# Patient Record
Sex: Male | Born: 2000 | Race: Black or African American | Hispanic: No | Marital: Single | Smoking: Never smoker
Health system: Southern US, Community
[De-identification: ages and names within clinical notes are randomized; demographics above are authoritative.]

## PROBLEM LIST (undated history)

## (undated) DIAGNOSIS — K5900 Constipation, unspecified: Secondary | ICD-10-CM

## (undated) DIAGNOSIS — K219 Gastro-esophageal reflux disease without esophagitis: Secondary | ICD-10-CM

## (undated) DIAGNOSIS — J45909 Unspecified asthma, uncomplicated: Secondary | ICD-10-CM

## (undated) DIAGNOSIS — D573 Sickle-cell trait: Secondary | ICD-10-CM

## (undated) DIAGNOSIS — R011 Cardiac murmur, unspecified: Secondary | ICD-10-CM

## (undated) DIAGNOSIS — L309 Dermatitis, unspecified: Secondary | ICD-10-CM

## (undated) HISTORY — DX: Dermatitis, unspecified: L30.9

---
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
## 2021-05-12 ENCOUNTER — Other Ambulatory Visit: Payer: Self-pay

## 2021-05-12 ENCOUNTER — Ambulatory Visit
Admission: EM | Admit: 2021-05-12 | Discharge: 2021-05-12 | Disposition: A | Discharge status: Home / Self Care | Payer: Medicaid Other | Attending: Physician Assistant | Admitting: Physician Assistant

## 2021-05-12 DIAGNOSIS — J101 Influenza due to other identified influenza virus with other respiratory manifestations: Secondary | ICD-10-CM

## 2021-05-12 LAB — POCT INFLUENZA A/B
Influenza A, POC: POSITIVE — AB
Influenza B, POC: POSITIVE — AB

## 2021-05-12 MED ORDER — OSELTAMIVIR PHOSPHATE 75 MG PO CAPS: 75.0000 mg | ORAL_CAPSULE | Freq: Two times a day (BID) | ORAL | 0 refills | Status: DC

## 2021-05-12 MED ORDER — ONDANSETRON 4 MG PO TBDP: 4.0000 mg | ORAL_TABLET | Freq: Three times a day (TID) | ORAL | 0 refills | Status: DC | PRN

## 2021-05-12 NOTE — ED Triage Notes (Signed)
3 day h/o abdominal pain, diarrhea and nausea with two days of congestion. No meds taken. Denies cough and fever.

## 2021-05-12 NOTE — ED Provider Notes (Signed)
Jeff Logan    CSN: [id number] Arrival date & time: 05/12/21  1241      History   Chief Complaint Chief Complaint  Patient presents with   Abdominal Pain   Diarrhea    HPI Jeff Logan is a 20 y.o. male.   Patient here today for evaluation of congestion, sore throat, nausea, vomiting and diarrhea that started 2 days ago.  He has not had fever.  He has had exposure to the flu.  He has not tried any medication for symptoms.  The history is provided by the patient.  Abdominal Pain Associated symptoms: diarrhea, nausea, sore throat and vomiting   Associated symptoms: no chills, no cough, no fever and no shortness of breath   Diarrhea Associated symptoms: abdominal pain and vomiting   Associated symptoms: no chills and no fever    Past Medical History:  Diagnosis Date   Asthma    Constipation    Eczema    GERD (gastroesophageal reflux disease)    Murmur    Sickle cell trait (HCC)     There are no problems to display for this patient.   History reviewed. No pertinent surgical history.     Home Medications    Prior to Admission medications   Medication Sig Start Date End Date Taking? Authorizing Provider  ondansetron (ZOFRAN-ODT) 4 MG disintegrating tablet Take 1 tablet (4 mg total) by mouth every 8 (eight) hours as needed for nausea or vomiting. 05/12/21  Yes Tomi Bamberger, PA-C  oseltamivir (TAMIFLU) 75 MG capsule Take 1 capsule (75 mg total) by mouth every 12 (twelve) hours. 05/12/21  Yes Tomi Bamberger, PA-C  albuterol (PROVENTIL HFA;VENTOLIN HFA) 108 (90 BASE) MCG/ACT inhaler Inhale 1 puff into the lungs every 6 (six) hours as needed for wheezing or shortness of breath.    [provider]  Beclomethasone Dipropionate (QVAR IN) Inhale into the lungs.    [provider]  ibuprofen (ADVIL) 800 MG tablet Take 1 tablet (800 mg total) by mouth 3 (three) times daily. 08/24/20   Wieters, Hallie C, PA-C  lansoprazole  (PREVACID) 15 MG capsule Take 15 mg by mouth daily at 12 noon.    [provider]  loratadine (CLARITIN) 10 MG tablet Take 10 mg by mouth daily.    [provider]  Polyethylene Glycol 3350 (MIRALAX PO) Take by mouth.    [provider]    Family History Family History  Problem Relation Age of Onset   Thyroid disease Mother    Heart disease Father    Heart attack Father    Sickle cell trait Father    Sickle cell trait Sister    Asthma Brother     Social History Social History   Tobacco Use   Smoking status: Never   Smokeless tobacco: Never  Vaping Use   Vaping Use: Never used  Substance Use Topics   Alcohol use: Never   Drug use: Never     Allergies   Cinnamon, Penicillins, and Zantac [ranitidine hcl]   Review of Systems Review of Systems  Constitutional:  Negative for chills and fever.  HENT:  Positive for congestion and sore throat. Negative for ear pain.   Eyes:  Negative for discharge and redness.  Respiratory:  Negative for cough and shortness of breath.   Gastrointestinal:  Positive for abdominal pain, diarrhea, nausea and vomiting.    Physical Exam Triage Vital Signs ED Triage Vitals  Enc Vitals Group  BP 05/12/21 1346 112/73     Pulse Rate 05/12/21 1346 68     Resp 05/12/21 1346 18     Temp 05/12/21 1346 98.1 F (36.7 C)     Temp Source 05/12/21 1346 Oral     SpO2 05/12/21 1346 96 %     Weight --      Height --      Head Circumference --      Peak Flow --      Pain Score 05/12/21 1348 0     Pain Loc --      Pain Edu? --      Excl. in GC? --    No data found.  Updated Vital Signs BP 112/73 (BP Location: Left Arm)   Pulse 68   Temp 98.1 F (36.7 C) (Oral)   Resp 18   SpO2 96%     Physical Exam Vitals and nursing note reviewed.  Constitutional:      General: He is not in acute distress.    Appearance: Normal appearance. He is not ill-appearing.  HENT:     Head: Normocephalic and atraumatic.     Nose:  Nose normal. No congestion.     Mouth/Throat:     Mouth: Mucous membranes are moist.     Pharynx: Oropharynx is clear. No oropharyngeal exudate or posterior oropharyngeal erythema.  Eyes:     Conjunctiva/sclera: Conjunctivae normal.  Cardiovascular:     Rate and Rhythm: Normal rate and regular rhythm.     Heart sounds: Normal heart sounds. No murmur heard. Pulmonary:     Effort: Pulmonary effort is normal. No respiratory distress.     Breath sounds: Normal breath sounds. No wheezing, rhonchi or rales.  Skin:    General: Skin is warm and dry.  Neurological:     Mental Status: He is alert.  Psychiatric:        Mood and Affect: Mood normal.        Thought Content: Thought content normal.     UC Treatments / Results  Labs (all labs ordered are listed, but only abnormal results are displayed) Labs Reviewed  POCT INFLUENZA A/B - Abnormal; Notable for the following components:      Result Value   Influenza A, POC Positive (*)    Influenza B, POC Positive (*)    All other components within normal limits    EKG   Radiology No results found.  Procedures Procedures (including critical Logan time)  Medications Ordered in UC Medications - No data to display  Initial Impression / Assessment and Plan / UC Course  I have reviewed the triage vital signs and the nursing notes.  Pertinent labs & imaging results that were available during my Logan of the patient were reviewed by me and considered in my medical decision making (see chart for details).    Flu test positive in office.  Will treat with Tamiflu and recommended symptomatic treatment otherwise.  Encouraged increase fluids and rest.  Recommend follow-up if symptoms fail to improve or worsen.  Final Clinical Impressions(s) / UC Diagnoses   Final diagnoses:  Influenza A   Discharge Instructions   None    ED Prescriptions     Medication Sig Dispense Auth. Provider   oseltamivir (TAMIFLU) 75 MG capsule Take 1 capsule  (75 mg total) by mouth every 12 (twelve) hours. 10 capsule Erma Pinto F, PA-C   ondansetron (ZOFRAN-ODT) 4 MG disintegrating tablet Take 1 tablet (4 mg total) by mouth every  8 (eight) hours as needed for nausea or vomiting. 20 tablet Tomi Bamberger, PA-C      PDMP not reviewed this encounter.   Tomi Bamberger, PA-C 05/12/21 1513

## 2021-05-29 ENCOUNTER — Ambulatory Visit (INDEPENDENT_AMBULATORY_CARE_PROVIDER_SITE_OTHER): Payer: Medicaid Other

## 2021-05-29 ENCOUNTER — Ambulatory Visit
Admission: EM | Admit: 2021-05-29 | Discharge: 2021-05-29 | Disposition: A | Discharge status: Home / Self Care | Payer: Medicaid Other | Attending: Physician Assistant | Admitting: Physician Assistant

## 2021-05-29 ENCOUNTER — Other Ambulatory Visit: Payer: Self-pay

## 2021-05-29 DIAGNOSIS — M25562 Pain in left knee: Secondary | ICD-10-CM

## 2021-05-29 MED ORDER — PREDNISONE 20 MG PO TABS: 40.0000 mg | ORAL_TABLET | Freq: Every day | ORAL | 0 refills | Status: AC

## 2021-05-29 NOTE — ED Provider Notes (Signed)
[name]    CSN: [id number] Arrival date & time: 05/29/21  1008      History   Chief Complaint Chief Complaint  Patient presents with   left knee pain    HPI Jeff Logan is a 20 y.o. male.   Patient here today for evaluation of left knee pain that started after he was bowling a few days ago.  He reports that he thinks he planted his foot incorrectly, as he had some mild pain in his knee with this, but the pain resolved.  He states as time went on that evening he started to have worsening pain.  He denies any numbness or tingling.  Straightening his knee will cause issues at times.  He also reports some weakness in the knee at times.  He denies any previous injury to his left knee.  He notes pain is typically present to his posterior knee.  The history is provided by the patient.   Past Medical History:  Diagnosis Date   Asthma    Constipation    Eczema    GERD (gastroesophageal reflux disease)    Murmur    Sickle cell trait (HCC)     There are no problems to display for this patient.   History reviewed. No pertinent surgical history.     Home Medications    Prior to Admission medications   Medication Sig Start Date End Date Taking? Authorizing Provider  predniSONE (DELTASONE) 20 MG tablet Take 2 tablets (40 mg total) by mouth daily with breakfast for 5 days. 05/29/21 06/03/21 Yes Tomi Bamberger, PA-C  albuterol (PROVENTIL HFA;VENTOLIN HFA) 108 (90 BASE) MCG/ACT inhaler Inhale 1 puff into the lungs every 6 (six) hours as needed for wheezing or shortness of breath.    [provider]  Beclomethasone Dipropionate (QVAR IN) Inhale into the lungs.    [provider]  ibuprofen (ADVIL) 800 MG tablet Take 1 tablet (800 mg total) by mouth 3 (three) times daily. 08/24/20   Wieters, Hallie C, PA-C  lansoprazole (PREVACID) 15 MG capsule Take 15 mg by mouth daily at 12 noon.    [provider]  loratadine (CLARITIN) 10  MG tablet Take 10 mg by mouth daily.    [provider]  ondansetron (ZOFRAN-ODT) 4 MG disintegrating tablet Take 1 tablet (4 mg total) by mouth every 8 (eight) hours as needed for nausea or vomiting. 05/12/21   Tomi Bamberger, PA-C  oseltamivir (TAMIFLU) 75 MG capsule Take 1 capsule (75 mg total) by mouth every 12 (twelve) hours. 05/12/21   Tomi Bamberger, PA-C  Polyethylene Glycol 3350 (MIRALAX PO) Take by mouth.    [provider]    Family History Family History  Problem Relation Age of Onset   Thyroid disease Mother    Heart disease Father    Heart attack Father    Sickle cell trait Father    Sickle cell trait Sister    Asthma Brother     Social History Social History   Tobacco Use   Smoking status: Never   Smokeless tobacco: Never  Vaping Use   Vaping Use: Never used  Substance Use Topics   Alcohol use: Never   Drug use: Never     Allergies   Cinnamon, Penicillins, and Zantac [ranitidine hcl]   Review of Systems Review of Systems  Constitutional:  Negative for chills and fever.  Eyes:  Negative for discharge and redness.  Respiratory:  Negative for shortness of breath.  Musculoskeletal:  Positive for arthralgias. Negative for joint swelling.  Skin:  Positive for color change and wound.  Neurological:  Positive for weakness. Negative for numbness.    Physical Exam Triage Vital Signs ED Triage Vitals  Enc Vitals Group     BP 05/29/21 1034 113/70     Pulse Rate 05/29/21 1034 64     Resp 05/29/21 1034 18     Temp 05/29/21 1034 97.8 F (36.6 C)     Temp Source 05/29/21 1034 Oral     SpO2 05/29/21 1034 98 %     Weight --      Height --      Head Circumference --      Peak Flow --      Pain Score 05/29/21 1035 3     Pain Loc --      Pain Edu? --      Excl. in GC? --    No data found.  Updated Vital Signs BP 113/70 (BP Location: Left Arm)   Pulse 64   Temp 97.8 F (36.6 C) (Oral)   Resp 18   SpO2 98%   Physical  Exam Vitals and nursing note reviewed.  Constitutional:      General: He is not in acute distress.    Appearance: Normal appearance. He is not ill-appearing.  HENT:     Head: Normocephalic and atraumatic.  Eyes:     Conjunctiva/sclera: Conjunctivae normal.  Cardiovascular:     Rate and Rhythm: Normal rate.  Pulmonary:     Effort: Pulmonary effort is normal.  Musculoskeletal:     Comments: Left knee without swelling or TTP. Full ROM of left knee  Neurological:     Mental Status: He is alert.  Psychiatric:        Mood and Affect: Mood normal.        Behavior: Behavior normal.        Thought Content: Thought content normal.     UC Treatments / Results  Labs (all labs ordered are listed, but only abnormal results are displayed) Labs Reviewed - No data to display  EKG   Radiology DG Knee Complete 4 Views Left  Result Date: 05/29/2021 CLINICAL DATA:  Twisting injury with pain. EXAM: LEFT KNEE - COMPLETE 4+ VIEW COMPARISON:  None. FINDINGS: No acute fracture or dislocation. Joint spaces maintained. No joint effusion. IMPRESSION: No acute osseous abnormality. Electronically Signed   By: Jeronimo Greaves M.D.   On: 05/29/2021 11:07    Procedures Procedures (including critical care time)  Medications Ordered in UC Medications - No data to display  Initial Impression / Assessment and Plan / UC Course  I have reviewed the triage vital signs and the nursing notes.  Pertinent labs & imaging results that were available during my care of the patient were reviewed by me and considered in my medical decision making (see chart for details).    Suspect likely mild sprain/strain of left knee and will treat with steroids.  Contact information provided for Ortho follow-up should symptoms fail to improve with treatment.  Final Clinical Impressions(s) / UC Diagnoses   Final diagnoses:  Acute pain of left knee   Discharge Instructions   None    ED Prescriptions     Medication Sig  Dispense Auth. Provider   predniSONE (DELTASONE) 20 MG tablet Take 2 tablets (40 mg total) by mouth daily with breakfast for 5 days. 10 tablet Tomi Bamberger, PA-C      PDMP not  reviewed this encounter.   Tomi Bamberger, PA-C 05/29/21 1349

## 2021-05-29 NOTE — ED Triage Notes (Signed)
Pt c/o left knee pain that comes and goes. Sharp, general weakness in knee, first noticed 2 days ago. Says he was bowling the other day and remembers being thrown off balance affecting that left knee. It didn't hurt at that time but noticed discomfort that evening.

## 2021-07-23 ENCOUNTER — Other Ambulatory Visit: Payer: Self-pay

## 2021-07-23 ENCOUNTER — Emergency Department (HOSPITAL_COMMUNITY): Payer: Medicaid Other

## 2021-07-23 ENCOUNTER — Emergency Department (HOSPITAL_COMMUNITY)
Admission: EM | Admit: 2021-07-23 | Discharge: 2021-07-23 | Disposition: A | Discharge status: Home / Self Care | Payer: Medicaid Other | Attending: Emergency Medicine | Admitting: Emergency Medicine

## 2021-07-23 DIAGNOSIS — J45909 Unspecified asthma, uncomplicated: Secondary | ICD-10-CM | POA: Diagnosis not present

## 2021-07-23 DIAGNOSIS — Z7951 Long term (current) use of inhaled steroids: Secondary | ICD-10-CM | POA: Diagnosis not present

## 2021-07-23 DIAGNOSIS — R0602 Shortness of breath: Secondary | ICD-10-CM | POA: Diagnosis present

## 2021-07-23 DIAGNOSIS — R0600 Dyspnea, unspecified: Secondary | ICD-10-CM

## 2021-07-23 DIAGNOSIS — Z20822 Contact with and (suspected) exposure to covid-19: Secondary | ICD-10-CM | POA: Diagnosis not present

## 2021-07-23 LAB — TROPONIN I (HIGH SENSITIVITY): Troponin I (High Sensitivity): <2 ng/L (ref <18)

## 2021-07-23 LAB — COMPREHENSIVE METABOLIC PANEL
ALT: 14 U/L (ref 0–44)
AST: 18 U/L (ref 15–41)
Albumin: 4.3 g/dL (ref 3.5–5.0)
Alkaline Phosphatase: 38 U/L (ref 38–126)
Anion gap: 10 (ref 5–15)
BUN: 12 mg/dL (ref 6–20)
CO2: 26 mmol/L (ref 22–32)
Calcium: 9.6 mg/dL (ref 8.9–10.3)
Chloride: 103 mmol/L (ref 98–111)
Creatinine, Ser: 1.06 mg/dL (ref 0.61–1.24)
GFR, Estimated: >60 mL/min (ref >60)
Glucose, Bld: 96 mg/dL (ref 70–99)
Potassium: 3.8 mmol/L (ref 3.5–5.1)
Sodium: 139 mmol/L (ref 135–145)
Total Bilirubin: 0.9 mg/dL (ref 0.3–1.2)
Total Protein: 7.4 g/dL (ref 6.5–8.1)

## 2021-07-23 LAB — URINALYSIS, MICROSCOPIC (REFLEX)
Bacteria, UA: NONE SEEN
Squamous Epithelial / HPF: NONE SEEN (ref 0–5)

## 2021-07-23 LAB — URINALYSIS, ROUTINE W REFLEX MICROSCOPIC
Bilirubin Urine: NEGATIVE
Glucose, UA: NEGATIVE mg/dL
Ketones, ur: NEGATIVE mg/dL
Leukocytes,Ua: NEGATIVE
Nitrite: NEGATIVE
Protein, ur: NEGATIVE mg/dL
Specific Gravity, Urine: 1.015 (ref 1.005–1.030)
pH: 6 (ref 5.0–8.0)

## 2021-07-23 LAB — CBC WITH DIFFERENTIAL/PLATELET
Abs Immature Granulocytes: 0.01 10*3/uL (ref 0.00–0.07)
Basophils Absolute: 0 10*3/uL (ref 0.0–0.1)
Basophils Relative: 1 %
Eosinophils Absolute: 0.2 10*3/uL (ref 0.0–0.5)
Eosinophils Relative: 4 %
HCT: 46 % (ref 39.0–52.0)
Hemoglobin: 15.1 g/dL (ref 13.0–17.0)
Immature Granulocytes: 0 %
Lymphocytes Relative: 37 %
Lymphs Abs: 2 10*3/uL (ref 0.7–4.0)
MCH: 26.7 pg (ref 26.0–34.0)
MCHC: 32.8 g/dL (ref 30.0–36.0)
MCV: 81.3 fL (ref 80.0–100.0)
Monocytes Absolute: 0.6 10*3/uL (ref 0.1–1.0)
Monocytes Relative: 11 %
Neutro Abs: 2.6 10*3/uL (ref 1.7–7.7)
Neutrophils Relative %: 47 %
Platelets: 220 10*3/uL (ref 150–400)
RBC: 5.66 MIL/uL (ref 4.22–5.81)
RDW: 12.8 % (ref 11.5–15.5)
WBC: 5.4 10*3/uL (ref 4.0–10.5)
nRBC: 0 % (ref 0.0–0.2)

## 2021-07-23 LAB — RESP PANEL BY RT-PCR (FLU A&B, COVID) ARPGX2
Influenza A by PCR: NEGATIVE
Influenza B by PCR: NEGATIVE
SARS Coronavirus 2 by RT PCR: NEGATIVE

## 2021-07-23 NOTE — ED Provider Triage Note (Signed)
Emergency Medicine Provider Triage Evaluation Note  Jeff Logan , a 21 y.o. male  was evaluated in triage.  Pt complains of shortness of breath, feeling weak globally.  He felt bad today since waking up.  He was at work [employer] when he started feeling short of breath.    He feels better now.   He didn't eat breakfast which is normal for him.   On chart review he has never had basic lab work done.   Physical Exam  BP 131/76 (BP Location: Left Arm)    Pulse 78    Temp 97.8 F (36.6 C) (Oral)    Resp (!) 24    SpO2 100%  Gen:   Awake, no distress   Resp:  Normal effort, no wheezing,  MSK:   Moves extremities without difficulty  Other:  No murmur.   Medical Decision Making  Medically screening exam initiated at 7:34 PM.  Appropriate orders placed.  Cruise D Logan was informed that the remainder of the evaluation will be completed by another provider, this initial triage assessment does not replace that evaluation, and the importance of remaining in the ED until their evaluation is complete.  Patient had a prolonged near syncopal episode today that happened at work.  He states that this happened mostly for about 30 minutes at least and he was sent down here for further evaluation. He does not have a history of having lab work done from what I can see and states that he just generally has been feeling bad since waking up today. With his shortness of breath will check labs, chest x-ray, and COVID test.  We will also check urine to evaluate for hydration status.   Cristina Gong, [place] 07/23/21 1943

## 2021-07-23 NOTE — ED Triage Notes (Signed)
Pt reports feeling "sluggish" today and then became sob when he got to work which got progressively worse throughout the day without relief with inhaler, but has now almost entirely resolved.

## 2021-07-23 NOTE — Discharge Instructions (Signed)
Continue your current medications.  Return as needed for recurrent or worsening symptoms.  Follow up with your doctor to be rechecked

## 2021-07-23 NOTE — ED Provider Notes (Signed)
[facility] EMERGENCY DEPARTMENT Provider Note   CSN: [id number] Arrival date & time: 07/23/21  1814     History  Chief Complaint  Patient presents with   Shortness of Breath    Jeff Logan is a 21 y.o. male.   Shortness of Breath   Pt started having shortness of breath this afternoon. No fevers or cough.  No wheezing.  The sx lasted for a few hours but have now resolved.  No CP or leg swelling.  Pt does have asthma, he was wheezing initially and took his inhalers which seemed to help.  Home Medications Prior to Admission medications   Medication Sig Start Date End Date Taking? Authorizing Provider  albuterol (PROVENTIL HFA;VENTOLIN HFA) 108 (90 BASE) MCG/ACT inhaler Inhale 1 puff into the lungs every 6 (six) hours as needed for wheezing or shortness of breath.    [provider]  Beclomethasone Dipropionate (QVAR IN) Inhale into the lungs.    [provider]  ibuprofen (ADVIL) 800 MG tablet Take 1 tablet (800 mg total) by mouth 3 (three) times daily. 08/24/20   Wieters, Hallie C, PA-C  lansoprazole (PREVACID) 15 MG capsule Take 15 mg by mouth daily at 12 noon.    [provider]  loratadine (CLARITIN) 10 MG tablet Take 10 mg by mouth daily.    [provider]  ondansetron (ZOFRAN-ODT) 4 MG disintegrating tablet Take 1 tablet (4 mg total) by mouth every 8 (eight) hours as needed for nausea or vomiting. 05/12/21   Tomi Bamberger, PA-C  oseltamivir (TAMIFLU) 75 MG capsule Take 1 capsule (75 mg total) by mouth every 12 (twelve) hours. 05/12/21   Tomi Bamberger, PA-C  Polyethylene Glycol 3350 (MIRALAX PO) Take by mouth.    [provider]      Allergies    Cinnamon, Penicillins, and Zantac [ranitidine hcl]    Review of Systems   Review of Systems  Respiratory:  Positive for shortness of breath.   All other systems reviewed and are negative.  Physical Exam Updated Vital Signs BP 131/76 (BP  Location: Left Arm)    Pulse 78    Temp 97.8 F (36.6 C) (Oral)    Resp (!) 24    SpO2 100%  Physical Exam Vitals and nursing note reviewed.  Constitutional:      General: He is not in acute distress.    Appearance: He is well-developed.  HENT:     Head: Normocephalic and atraumatic.     Right Ear: External ear normal.     Left Ear: External ear normal.  Eyes:     General: No scleral icterus.       Right eye: No discharge.        Left eye: No discharge.     Conjunctiva/sclera: Conjunctivae normal.  Neck:     Trachea: No tracheal deviation.  Cardiovascular:     Rate and Rhythm: Normal rate and regular rhythm.  Pulmonary:     Effort: Pulmonary effort is normal. No respiratory distress.     Breath sounds: Normal breath sounds. No stridor. No wheezing or rales.  Abdominal:     General: Bowel sounds are normal. There is no distension.     Palpations: Abdomen is soft.     Tenderness: There is no abdominal tenderness. There is no guarding or rebound.  Musculoskeletal:        General: No tenderness or deformity.     Cervical back: Neck supple.  Skin:  General: Skin is warm and dry.     Findings: No rash.  Neurological:     General: No focal deficit present.     Mental Status: He is alert.     Cranial Nerves: No cranial nerve deficit (no facial droop, extraocular movements intact, no slurred speech).     Sensory: No sensory deficit.     Motor: No abnormal muscle tone or seizure activity.     Coordination: Coordination normal.  Psychiatric:        Mood and Affect: Mood normal.    ED Results / Procedures / Treatments   Labs (all labs ordered are listed, but only abnormal results are displayed) Labs Reviewed  URINALYSIS, ROUTINE W REFLEX MICROSCOPIC - Abnormal; Notable for the following components:      Result Value   Hgb urine dipstick TRACE (*)    All other components within normal limits  RESP PANEL BY RT-PCR (FLU A&B, COVID) ARPGX2  COMPREHENSIVE METABOLIC PANEL  CBC  WITH DIFFERENTIAL/PLATELET  URINALYSIS, MICROSCOPIC (REFLEX)  TROPONIN I (HIGH SENSITIVITY)    EKG EKG Interpretation  Date/Time:  Thursday July 23 2021 18:41:11 EST Ventricular Rate:  62 PR Interval:  124 QRS Duration: 88 QT Interval:  376 QTC Calculation: 381 R Axis:   29 Text Interpretation: Normal sinus rhythm Early repolarization Abnormal ECG No previous ECGs available Confirmed by Linwood Dibbles [phone]) on 07/23/2021 10:13:24 PM  Radiology DG Chest 2 View  Result Date: 07/23/2021 CLINICAL DATA:  short of breath, near syncope EXAM: CHEST - 2 VIEW COMPARISON:  Chest x-ray 06/06/2006 FINDINGS: The heart and mediastinal contours are within normal limits. No focal consolidation. No pulmonary edema. No pleural effusion. No pneumothorax. No acute osseous abnormality. IMPRESSION: No active cardiopulmonary disease. Electronically Signed   By: Tish Frederickson M.D.   On: 07/23/2021 20:22    Procedures Procedures    Medications Ordered in ED Medications - No data to display  ED Course/ Medical Decision Making/ A&P                           Medical Decision Making  Shortness of breath Patient presented with complaints of shortness of breath.  Asymptomatic by the time he arrived in the ED.  Patient's ED work-up is reassuring.  He has a normal CBC.  No signs of anemia.  Electrolyte does not show any signs of dehydration or electrolyte abnormalities.  COVID and flu are negative.  Troponin does not show any signs of cardiac ischemia.  Patient is not tachycardic or tachypneic.  Low risk for PE.  Doubt pulmonary embolism.  Possible the patient may have had an asthma exacerbation and he does have asthma.  Currently is not wheezing and he feels fine.  Evaluation and diagnostic testing in the emergency department does not suggest an emergent condition requiring admission or immediate intervention beyond what has been performed at this time.  The patient is safe for discharge and has been instructed  to return immediately for worsening symptoms, change in symptoms or any other concerns.         Final Clinical Impression(s) / ED Diagnoses Final diagnoses:  Dyspnea, unspecified type    Rx / DC Orders ED Discharge Orders     None         Linwood Dibbles, MD 07/23/21 2237

## 2021-12-19 ENCOUNTER — Ambulatory Visit
Admission: EM | Admit: 2021-12-19 | Discharge: 2021-12-19 | Disposition: A | Discharge status: Home / Self Care | Payer: Medicaid Other | Attending: Urgent Care | Admitting: Urgent Care

## 2021-12-19 ENCOUNTER — Encounter: Payer: Self-pay | Admitting: Emergency Medicine

## 2021-12-19 DIAGNOSIS — J029 Acute pharyngitis, unspecified: Secondary | ICD-10-CM

## 2021-12-19 DIAGNOSIS — R0982 Postnasal drip: Secondary | ICD-10-CM | POA: Diagnosis not present

## 2021-12-19 LAB — POCT RAPID STREP A (OFFICE): Rapid Strep A Screen: NEGATIVE

## 2021-12-19 LAB — POCT MONO SCREEN (KUC): Mono, POC: NEGATIVE

## 2021-12-19 MED ORDER — FLUTICASONE PROPIONATE 50 MCG/ACT NA SUSP: 1.0000 | Freq: Two times a day (BID) | NASAL | 0 refills | Status: AC

## 2021-12-19 NOTE — ED Triage Notes (Signed)
Pt here with sore, scratchy throat x 2 days with fatigue.

## 2021-12-19 NOTE — Discharge Instructions (Addendum)
Your sore throat is likely related to a virus. Your strep and mono tests are negative. We will call with the results of the throat swab if it requires treatment with antibiotics. Please alternate tylenol with ibuprofen to help with the discomfort or fever. You may also try chloraseptic spray or cepacol lozenges. Monitor for fever >100.3, swollen lymph nodes or white spots on the back of the throat which may indicate a need to return to our clinic.  Use flonase 1-2 sprays daily to aid with post nasal drainage. Out of work x 2 days. Rest, increase hydration

## 2021-12-20 NOTE — ED Provider Notes (Signed)
[facility]    CSN: [id number] Arrival date & time: 12/19/21  1006      History   Chief Complaint Chief Complaint  Patient presents with   Sore Throat    HPI Jeff Logan is a 21 y.o. male.   Pleasant 21yo male presents today with a 2 day hx of sore throat and fatigue. States sx started Thursday. Felt feverish, but did not take temp with thermometer. Has been excessively tired. 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 Denies nausea, vomiting or diarrhea. Denies cough, ear pain, sinus pressure, or rash. Did not try any OTC medications. Does have a history of asthma and has been taking all of his home medications.   Sore Throat    Past Medical History:  Diagnosis Date   Asthma    Constipation    Eczema    GERD (gastroesophageal reflux disease)    Murmur    Sickle cell trait (HCC)     There are no problems to display for this patient.   History reviewed. No pertinent surgical history.     Home Medications    Prior to Admission medications   Medication Sig Start Date End Date Taking? Authorizing Provider  fluticasone (FLONASE) 50 MCG/ACT nasal spray Place 1 spray into both nostrils in the morning and at bedtime. 12/19/21  Yes Barnes Florek L, PA  albuterol (PROVENTIL HFA;VENTOLIN HFA) 108 (90 BASE) MCG/ACT inhaler Inhale 1 puff into the lungs every 6 (six) hours as needed for wheezing or shortness of breath.    [provider]  Beclomethasone Dipropionate (QVAR IN) Inhale into the lungs.    [provider]  ibuprofen (ADVIL) 800 MG tablet Take 1 tablet (800 mg total) by mouth 3 (three) times daily. 08/24/20   Wieters, Hallie C, PA-C  lansoprazole (PREVACID) 15 MG capsule Take 15 mg by mouth daily at 12 noon.    [provider]  loratadine (CLARITIN) 10 MG tablet Take 10 mg by mouth daily.    [provider]    Family History Family History  Problem Relation  Age of Onset   Thyroid disease Mother    Heart disease Father    Heart attack Father    Sickle cell trait Father    Sickle cell trait Sister    Asthma Brother     Social History Social History   Tobacco Use   Smoking status: Never   Smokeless tobacco: Never  Vaping Use   Vaping Use: Never used  Substance Use Topics   Alcohol use: Never   Drug use: Never     Allergies   Cinnamon, Penicillins, and Zantac [ranitidine hcl]   Review of Systems Review of Systems As per HPI  Physical Exam Triage Vital Signs ED Triage Vitals  Enc Vitals Group     BP 12/19/21 1022 122/81     Pulse Rate 12/19/21 1022 74     Resp 12/19/21 1022 20     Temp 12/19/21 1022 99 F (37.2 C)     Temp Source 12/19/21 1022 Oral     SpO2 12/19/21 1022 97 %     Weight --      Height --      Head Circumference --      Peak Flow --      Pain Score 12/19/21 1023 4     Pain Loc --      Pain Edu? --  Excl. in GC? --    No data found.  Updated Vital Signs BP 122/81 (BP Location: Left Arm)   Pulse 74   Temp 99 F (37.2 C) (Oral)   Resp 20   SpO2 97%   Visual Acuity Right Eye Distance:   Left Eye Distance:   Bilateral Distance:    Right Eye Near:   Left Eye Near:    Bilateral Near:     Physical Exam Vitals and nursing note reviewed.  Constitutional:      General: He is not in acute distress.    Appearance: He is well-developed and normal weight. He is not ill-appearing, toxic-appearing or diaphoretic.  HENT:     Head: Normocephalic and atraumatic.     Right Ear: Tympanic membrane and ear canal normal. No drainage, swelling or tenderness. No middle ear effusion. Tympanic membrane is not erythematous.     Left Ear: Tympanic membrane and ear canal normal. No drainage, swelling or tenderness.  No middle ear effusion. Tympanic membrane is not erythematous.     Nose: No congestion or rhinorrhea.     Mouth/Throat:     Mouth: Mucous membranes are moist. No oral lesions.     Pharynx:  Oropharynx is clear. Uvula midline. No pharyngeal swelling, oropharyngeal exudate, posterior oropharyngeal erythema or uvula swelling.     Tonsils: No tonsillar exudate or tonsillar abscesses.     Comments: Lymphoid hyperplasia posterior pharynx Eyes:     Extraocular Movements:     Right eye: Normal extraocular motion.     Left eye: Normal extraocular motion.     Conjunctiva/sclera: Conjunctivae normal.     Pupils: Pupils are equal, round, and reactive to light.  Cardiovascular:     Rate and Rhythm: Normal rate and regular rhythm.     Heart sounds: Normal heart sounds.  Pulmonary:     Effort: Pulmonary effort is normal. No respiratory distress.     Breath sounds: Normal breath sounds. No stridor. No wheezing, rhonchi or rales.  Chest:     Chest wall: No tenderness.  Abdominal:     Palpations: Abdomen is soft.  Musculoskeletal:     Cervical back: Normal range of motion and neck supple.  Lymphadenopathy:     Cervical: No cervical adenopathy.  Skin:    General: Skin is warm and dry.     Capillary Refill: Capillary refill takes less than 2 seconds.     Coloration: Skin is not pale.     Findings: No erythema or rash.  Neurological:     General: No focal deficit present.     Mental Status: He is alert and oriented to person, place, and time.  Psychiatric:        Mood and Affect: Mood normal.        Behavior: Behavior normal.      UC Treatments / Results  Labs (all labs ordered are listed, but only abnormal results are displayed) Labs Reviewed  CULTURE, GROUP A STREP [facility])  POCT RAPID STREP A (OFFICE)  POCT MONO SCREEN ([facility])    EKG   Radiology No results found.  Procedures Procedures (including critical care time)  Medications Ordered in UC Medications - No data to display  Initial Impression / Assessment and Plan / UC Course  I have reviewed the triage vital signs and the nursing notes.  Pertinent labs & imaging results that were available during my care of  the patient were reviewed by me and considered in my medical decision making (see  chart for details).     Acute pharyngitis - monospot and strep testing negative in office. Suspect viral in nature. Supportive care, will call if throat culture positive to initiate abx at that time Post nasal drainage - noted in office. Possibly the cause of the sore throat. Start flonase.   Final Clinical Impressions(s) / UC Diagnoses   Final diagnoses:  Acute pharyngitis, unspecified etiology  Post-nasal drainage     Discharge Instructions      Your sore throat is likely related to a virus. Your strep and mono tests are negative. We will call with the results of the throat swab if it requires treatment with antibiotics. Please alternate tylenol with ibuprofen to help with the discomfort or fever. You may also try chloraseptic spray or cepacol lozenges. Monitor for fever >100.3, swollen lymph nodes or white spots on the back of the throat which may indicate a need to return to our clinic.  Use flonase 1-2 sprays daily to aid with post nasal drainage. Out of work x 2 days. Rest, increase hydration    ED Prescriptions     Medication Sig Dispense Auth. Provider   fluticasone (FLONASE) 50 MCG/ACT nasal spray Place 1 spray into both nostrils in the morning and at bedtime. 16 mL Merit Maybee L, PA      PDMP not reviewed this encounter.   Maretta Bees, [place] 12/20/21 [phone]

## 2021-12-22 LAB — CULTURE, GROUP A STREP (THRC)

## 2022-06-02 ENCOUNTER — Other Ambulatory Visit: Payer: Self-pay

## 2022-06-02 ENCOUNTER — Encounter (HOSPITAL_BASED_OUTPATIENT_CLINIC_OR_DEPARTMENT_OTHER): Payer: Self-pay | Admitting: Emergency Medicine

## 2022-06-02 ENCOUNTER — Emergency Department (HOSPITAL_BASED_OUTPATIENT_CLINIC_OR_DEPARTMENT_OTHER)
Admission: EM | Admit: 2022-06-02 | Discharge: 2022-06-02 | Disposition: A | Discharge status: Home / Self Care | Payer: Medicaid Other | Attending: Emergency Medicine | Admitting: Emergency Medicine

## 2022-06-02 DIAGNOSIS — Z1152 Encounter for screening for COVID-19: Secondary | ICD-10-CM | POA: Insufficient documentation

## 2022-06-02 DIAGNOSIS — B349 Viral infection, unspecified: Secondary | ICD-10-CM | POA: Diagnosis not present

## 2022-06-02 DIAGNOSIS — R0602 Shortness of breath: Secondary | ICD-10-CM | POA: Diagnosis not present

## 2022-06-02 DIAGNOSIS — R509 Fever, unspecified: Secondary | ICD-10-CM | POA: Diagnosis present

## 2022-06-02 LAB — RESP PANEL BY RT-PCR (RSV, FLU A&B, COVID)  RVPGX2
Influenza A by PCR: NEGATIVE
Influenza B by PCR: NEGATIVE
Resp Syncytial Virus by PCR: NEGATIVE
SARS Coronavirus 2 by RT PCR: NEGATIVE

## 2022-06-02 LAB — GROUP A STREP BY PCR: Group A Strep by PCR: NOT DETECTED

## 2022-06-02 NOTE — Discharge Instructions (Addendum)
Your respiratory panel is within normal limits.  You will need to follow-up with your primary care physician as needed.  You may continue supportive treatment with over-the-counter medication.

## 2022-06-02 NOTE — ED Triage Notes (Signed)
Sore throat headaches productive cough and sob x 2 sob. Temp at home 100.1

## 2022-06-02 NOTE — ED Notes (Signed)
Pt verbalized understanding of d/c instructions, meds, and followup care. Denies questions. VSS, no distress noted. Steady gait to exit with all belongings.  ?

## 2022-06-02 NOTE — ED Provider Notes (Signed)
[facility] DEPT Provider Note   CSN: [id number] Arrival date & time: 06/02/22  1429     History  Chief Complaint  Patient presents with   Cough    Jeff Logan is a 21 y.o. male.  21 year old male with no past medical history presents to the ED with a chief complaint of sore throat, cough, shortness of breath which has been ongoing for the past 2 days.  Does report a low-grade fever of 100 at home.  Has been taking "a cough drops "for his symptoms without any improvement.  He reports he did see his PCP.  No for COVID-19 or influenza which were negative.  He has not been taking any medication for improvement in symptoms.  Symptoms are worsened when he swallows.  Does report noting some spots of blood when he coughed which have now subsided.  No chest pain, no prior history of blood clots, no tobacco use.  The history is provided by the patient and medical records.  Cough Associated symptoms: no fever        Home Medications Prior to Admission medications   Medication Sig Start Date End Date Taking? Authorizing Provider  albuterol (PROVENTIL HFA;VENTOLIN HFA) 108 (90 BASE) MCG/ACT inhaler Inhale 1 puff into the lungs every 6 (six) hours as needed for wheezing or shortness of breath.    [provider]  Beclomethasone Dipropionate (QVAR IN) Inhale into the lungs.    [provider]  fluticasone (FLONASE) 50 MCG/ACT nasal spray Place 1 spray into both nostrils in the morning and at bedtime. 12/19/21   Crain, Whitney L, PA  ibuprofen (ADVIL) 800 MG tablet Take 1 tablet (800 mg total) by mouth 3 (three) times daily. 08/24/20   Wieters, Hallie C, PA-C  lansoprazole (PREVACID) 15 MG capsule Take 15 mg by mouth daily at 12 noon.    [provider]  loratadine (CLARITIN) 10 MG tablet Take 10 mg by mouth daily.    [provider]      Allergies    Cinnamon, Penicillins, and Zantac [ranitidine hcl]    Review of  Systems   Review of Systems  Constitutional:  Negative for fever.  Respiratory:  Positive for cough.     Physical Exam Updated Vital Signs BP 127/72 (BP Location: Right Arm)   Pulse 62   Temp 98.3 F (36.8 C)   Resp 16   Ht 5\' 8"  (1.727 m)   Wt 68 kg   SpO2 100%   BMI 22.81 kg/m  Physical Exam Vitals and nursing note reviewed.  Constitutional:      Appearance: He is well-developed.  HENT:     Head: Normocephalic and atraumatic.  Eyes:     General: No scleral icterus.    Pupils: Pupils are equal, round, and reactive to light.  Cardiovascular:     Heart sounds: Normal heart sounds.  Pulmonary:     Effort: Pulmonary effort is normal.     Breath sounds: Normal breath sounds. No wheezing.  Chest:     Chest wall: No tenderness.  Abdominal:     General: Bowel sounds are normal. There is no distension.     Palpations: Abdomen is soft.     Tenderness: There is no abdominal tenderness.  Musculoskeletal:        General: No tenderness or deformity.     Cervical back: Normal range of motion.  Skin:    General: Skin is warm and dry.  Neurological:  Mental Status: He is alert and oriented to person, place, and time.     ED Results / Procedures / Treatments   Labs (all labs ordered are listed, but only abnormal results are displayed) Labs Reviewed  RESP PANEL BY RT-PCR (RSV, FLU A&B, COVID)  [id number]  GROUP A STREP BY PCR    EKG None  Radiology No results found.  Procedures Procedures    Medications Ordered in ED Medications - No data to display  ED Course/ Medical Decision Making/ A&P                           Medical Decision Making    Presents to the ED with upper respiratory infection, reporting sore throat, cough, subjective fevers.  Has been having the symptoms for the past 3 days, no improvement despite over-the-counter medication.  Evaluation oropharynx is clear, no tonsillar abscess, no PTA noted.  Lungs are clear to auscultation with no wheezing  rhonchi or rales.  Denies any tobacco use, no chest pain on today's visit.  No prior history of blood clots.  Respiratory panel is negative for COVID-19, influenza.  We discussed these at length.  I do feel that patient can continue home with supportive treatment.  He is agreeable to plan and treatment at this time.  Patient is hemodynamically stable for discharge.   Portions of this note were generated with Scientist, clinical (histocompatibility and immunogenetics). Dictation errors may occur despite best attempts at proofreading.  Final Clinical Impression(s) / ED Diagnoses Final diagnoses:  Viral illness    Rx / DC Orders ED Discharge Orders     None         Claude Manges, PA-C 06/02/22 1859    Glyn Ade, MD 06/05/22 [phone]

## 2023-10-03 IMAGING — DX DG CHEST 2V
2 series · 2 of 2 positions shown · non-contrast
Comparison: Chest x-ray 06/06/2006

CLINICAL DATA: short of breath, near syncope

EXAM:
CHEST - 2 VIEW

[chest ap]
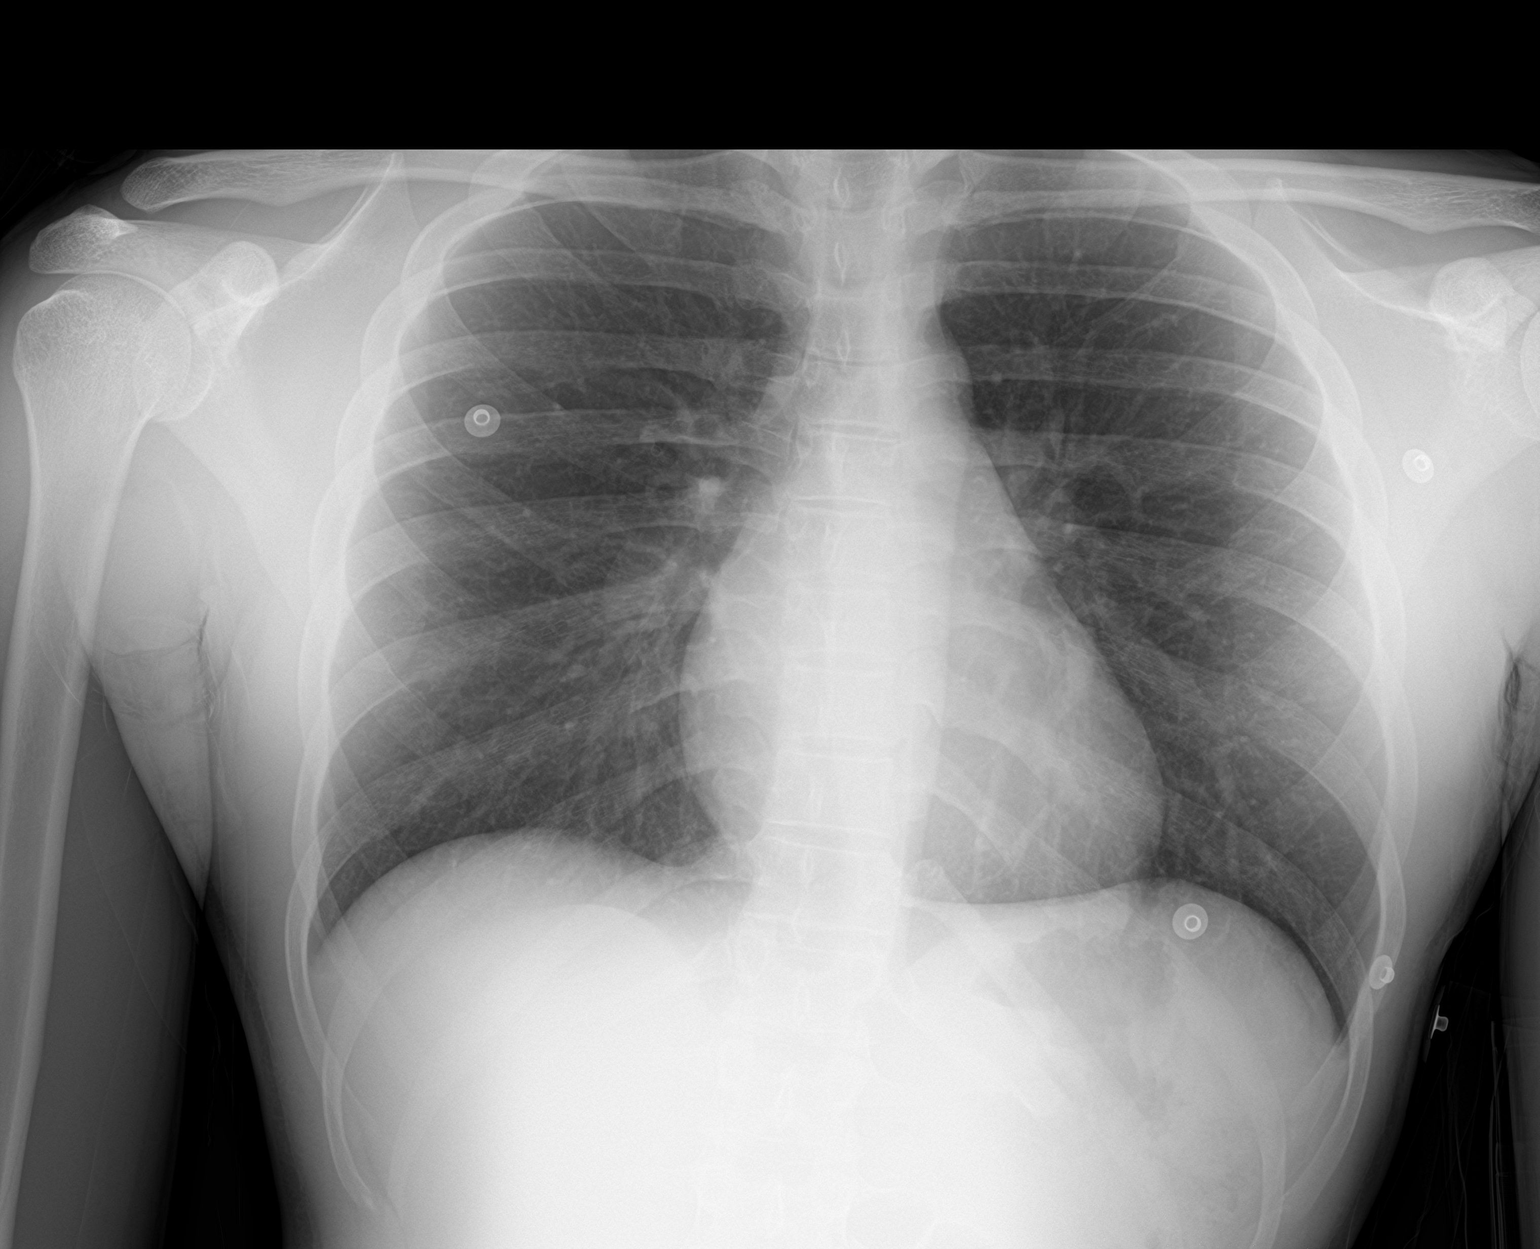

[chest lat]
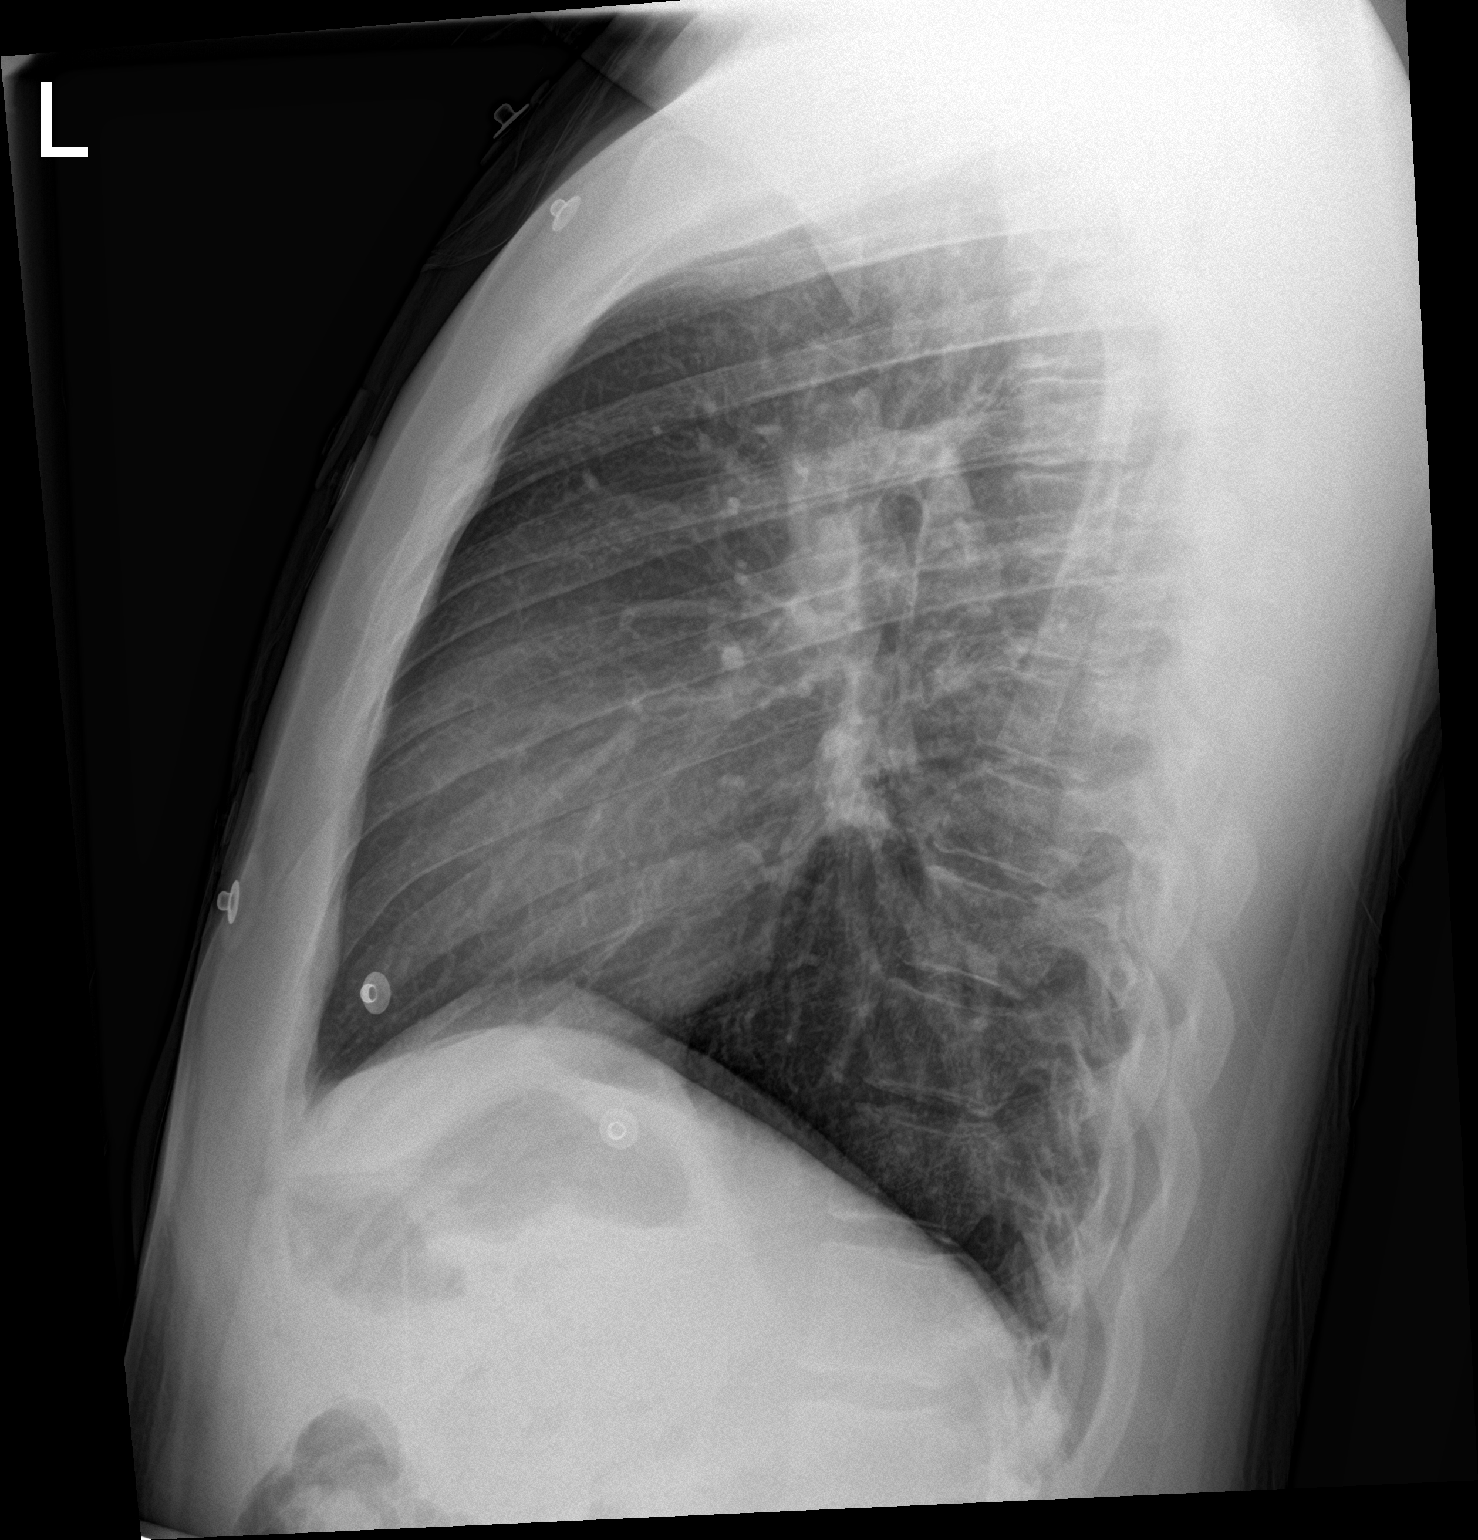

[2 of 2 positions shown; findings below may reference images not displayed]

FINDINGS: The heart and mediastinal contours are within normal limits.

No focal consolidation. No pulmonary edema. No pleural effusion. No
pneumothorax.

No acute osseous abnormality.
IMPRESSION: No active cardiopulmonary disease.
